# Patient Record
Sex: Male | Born: 1995 | Race: Black or African American | Hispanic: No | Marital: Single | Smoking: Never smoker
Health system: Southern US, Community
[De-identification: ages and names within clinical notes are randomized; demographics above are authoritative.]

---

## 2014-11-16 ENCOUNTER — Encounter (HOSPITAL_COMMUNITY): Payer: Self-pay | Admitting: Emergency Medicine

## 2014-11-16 ENCOUNTER — Emergency Department (HOSPITAL_COMMUNITY)

## 2014-11-16 ENCOUNTER — Emergency Department (HOSPITAL_COMMUNITY)
Admission: EM | Admit: 2014-11-16 | Discharge: 2014-11-16 | Disposition: A | Discharge status: Home / Self Care | Attending: Emergency Medicine | Admitting: Emergency Medicine

## 2014-11-16 DIAGNOSIS — Y998 Other external cause status: Secondary | ICD-10-CM | POA: Insufficient documentation

## 2014-11-16 DIAGNOSIS — Y92310 Basketball court as the place of occurrence of the external cause: Secondary | ICD-10-CM | POA: Insufficient documentation

## 2014-11-16 DIAGNOSIS — Y9367 Activity, basketball: Secondary | ICD-10-CM | POA: Insufficient documentation

## 2014-11-16 DIAGNOSIS — W51XXXA Accidental striking against or bumped into by another person, initial encounter: Secondary | ICD-10-CM | POA: Diagnosis not present

## 2014-11-16 DIAGNOSIS — S43102A Unspecified dislocation of left acromioclavicular joint, initial encounter: Secondary | ICD-10-CM | POA: Diagnosis not present

## 2014-11-16 DIAGNOSIS — S4992XA Unspecified injury of left shoulder and upper arm, initial encounter: Secondary | ICD-10-CM | POA: Diagnosis present

## 2014-11-16 NOTE — ED Provider Notes (Signed)
CSN: [id number]     Arrival date & time 11/16/14  0231 History   First MD Initiated Contact with Patient 11/16/14 0245     Chief Complaint  Patient presents with  . Shoulder Injury     (Consider location/radiation/quality/duration/timing/severity/associated sxs/prior Treatment) HPI Comments: 19 year old male who presents with left shoulder pain. Yesterday the patient was playing basketball and collided with another player. He struck his left shoulder and has now had constant, mild left shoulder pain which feels like a muscle tightness/soreness. He denies any numbness or tingling in his fingers. Normal strength and movement of his left hand and elbow. No other injuries.  Patient is a 19 y.o. male presenting with shoulder injury. The history is provided by the patient.  Shoulder Injury    History reviewed. No pertinent past medical history. History reviewed. No pertinent past surgical history. No family history on file. Social History  Substance Use Topics  . Smoking status: Never Smoker   . Smokeless tobacco: None  . Alcohol Use: No    Review of Systems 10 Systems reviewed and are negative for acute change except as noted in the HPI.   Allergies  Review of patient's allergies indicates no known allergies.  Home Medications   Prior to Admission medications   Medication Sig Start Date End Date Taking? Authorizing Provider  Phenyleph-Doxylamine-DM-APAP (NYQUIL SEVERE COLD/FLU) 5-6.25-10-325 MG/15ML LIQD Take 15 mLs by mouth as needed (for pain).   Yes Historical Provider, MD   BP 126/65 mmHg  Pulse 58  Temp(Src) 98.3 F (36.8 C) (Oral)  Resp 16  Wt 152 lb 8 oz (69.174 kg)  SpO2 100% Physical Exam  Constitutional: He appears well-developed and well-nourished. No distress.  HENT:  Head: Normocephalic and atraumatic.  Cardiovascular: Normal rate, regular rhythm, normal heart sounds and intact distal pulses.   No murmur heard. Pulmonary/Chest: Effort normal and breath  sounds normal. No respiratory distress.  Musculoskeletal:  Normal range of motion of left shoulder with mild pain at abduction greater than 90; normal strength and sensation left hand; 2+ radial pulses; normal range of motion at wrist and elbow; no obvious shoulder asymmetry  Skin: Skin is warm and dry.  Psychiatric: He has a normal mood and affect. Judgment normal.  Nursing note and vitals reviewed.   ED Course  Procedures (including critical care time)  Imaging Review Dg Shoulder Left  11/16/2014  CLINICAL DATA:  Left shoulder pain after injury playing basketball. Pain superiorly. EXAM: LEFT SHOULDER - 2+ VIEW COMPARISON:  None. FINDINGS: No acute fracture. Glenohumeral joint is congruent. Minimal superior migration of the distal clavicle and borderline widening of the acromioclavicular joint measuring 5 mm. Coracoclavicular joint space is maintained. No soft tissue abnormality. The growth plates are fusing. IMPRESSION: Suspect grade 1 AC joint separation.  No acute fracture. Electronically Signed   By: Rubye Oaks M.D.   On: 11/16/2014 03:09   I have personally reviewed and evaluated these images as part of my medical decision-making.   EKG Interpretation None      MDM   Final diagnoses:  Acromioclavicular separation, type 1, left, initial encounter    19 year old male who presents with left shoulder pain after colliding with another basketball player yesterday. She was neurovascularly intact on exam. Normal range of motion although pain with abduction of shoulder greater than 90. Plain film suggests grade 1 AC joint separation. No obvious shoulder asymmetry on exam. Instructed on supportive care and provided with sling. Provided with orthopedics follow-up information with any problems.  Patient voiced understanding of follow-up plan and return precautions. Patient discharged in satisfactory condition.    Laurence Spatesachel Morgan Hamzeh Tall, MD 11/16/14 [phone]

## 2014-11-16 NOTE — ED Notes (Signed)
Pt just returned from Xray  

## 2014-11-16 NOTE — ED Notes (Signed)
Pt. reports left shoulder joint injury yesterday while playing basketball ( collided with another player) , reports pain , stiffness and mild swelling .

## 2014-11-16 NOTE — Discharge Instructions (Signed)
Shoulder Separation °A shoulder separation (acromioclavicular separation) is an injury to the connecting tissue (ligament) between the top of your shoulder blade (acromion) and your collarbone (clavicle). °The ligament may be stretched, partially torn, or completely torn. °· A stretched ligament may not cause very much pain, and it does not move the collarbone out of place. A stretched ligament looks normal on an X-ray. °· An injury that is a bit worse may partially tear a ligament and move the collarbone slightly out of place. °· A serious injury completely tears both shoulder ligaments. This moves the collarbone severely out of position and changes the way that the shoulder looks (deformity). °CAUSES °The most common cause of a shoulder separation is falling on or receiving a blow to the top of the shoulder. Falling with an outstretched arm may also cause this injury. °RISK FACTORS °You may be at greater risk of a shoulder separation if: °· You are male. °· You are younger than age 35. °· You play a contact sport, such as football or hockey. °SIGNS AND SYMPTOMS °The most common symptom of a shoulder separation is pain on the top of the shoulder after falling on it or receiving a blow to it. Other signs and symptoms include: °· Shoulder deformity. °· Swelling of the shoulder. °· Decreased ability to move the shoulder. °· Bruising on top of the shoulder. °DIAGNOSIS °Your health care provider may suspect a shoulder separation based on your symptoms and the details of a recent injury. A physical exam will be done. During this exam, the health care provider may: °· Press on your shoulder. °· Test the movement of your shoulder. °· Ask you to hold a weight in your hand to see if the separation increases. °· Do an X-ray. °TREATMENT °· A stretch injury may require only a sling, pain medicine, and cold packs. This treatment may last for 2-12 weeks. You may also have physical therapy. A physical therapist will teach you to  do daily exercises to strengthen your shoulder muscles and prevent stiffness. °· A complete tear may require surgery to repair the torn ligament. After surgery, you will also require a sling, pain medicine, and cold packs. Recovery may take longer. You may also need more physical therapy. °HOME CARE INSTRUCTIONS °· Take medicines only as directed by your health care provider. °· Apply ice to the top of your shoulder: °· Put ice in a plastic bag. °· Place a towel between your skin and the bag. °· Leave the ice on for 20 minutes, 2-3 times a day. °· Wear your sling or splint as directed by your health care provider. °· You may be able to remove your sling to do your physical therapy exercises. °· Ask your health care provider when you can stop wearing the sling. °· Do not do any activities that make your pain worse. °· Do not lift anything that is heavier than 10 lb (4.5 kg) on the injured side of your body. °· Ask your health care provider when you can return to athletic activities. °SEEK MEDICAL CARE IF: °· Your pain medicine is not relieving your pain. °· Your pain and stiffness are not improving after 2 weeks. °· You are unable to do your physical therapy exercises because of pain or stiffness. °  °This information is not intended to replace advice given to you by your health care provider. Make sure you discuss any questions you have with your health care provider. °  °Document Released: 10/23/2004 Document Revised: 02/03/2014   Document Reviewed: 06/15/2013 °Elsevier Interactive Patient Education ©2016 [employer] ° °Acromioclavicular Separation With Rehab °The acromioclavicular joint is the joint between the roof of the shoulder (acromion) and the collarbone (clavicle). It is vulnerable to injury. An acromioclavicular (AC) separation is a partial or complete tear (sprain), injury, or redness and soreness (inflammation) of the ligaments that cross the acromioclavicular joint and hold it in place. There are two  ligaments in this area that are vulnerable to injury, the acromioclavicular ligament and the coracoclavicular ligament. °SYMPTOMS  °· Tenderness and swelling, or a bump on top of the shoulder (at the AC joint). °· Bruising (contusion) in the area within 48 hours of injury. °· Loss of strength or pain when reaching over the head or across the body. °CAUSES  °AC separation is caused by direct trauma to the joint (falling on your shoulder) or indirect trauma (falling on an outstretched arm). °RISK INCREASES WITH: °· Sports that require contact or collision, throwing sports (i.e. racquetball, squash). °· Poor strength and flexibility. °· Previous shoulder sprain or dislocation. °· Poorly fitted or padded protective equipment. °PREVENTION  °· Warm-up and stretch properly before activity. °· Maintain physical fitness: °¨ Shoulder strength. °¨ Shoulder flexibility. °¨ Cardiovascular fitness. °· Wear properly fitted and padded protective equipment. °· Learn and use proper technique when playing sports. Have a coach correct improper technique, including falling and landing. °· Apply taping, protective strapping or padding, or an adhesive bandage as recommended before practice or competition. °PROGNOSIS  °· If treated properly, the symptoms of AC separation can be expected to go away. °· If treated improperly, permanent disability may occur unless surgery is performed. °· Healing time varies with type of sport and position, arm injured (dominant versus non-dominant) and severity of sprain. °RELATED COMPLICATIONS °· Weakness and fatigue of the arm or shoulder are possible but uncommon. °· Pain and inflammation of the AC joint may continue. °· Prolonged healing time may be necessary if usual activities are resumed too early. This causes a susceptibility to recurrent injury. °· Prolonged disability may occur. °· The shoulder may remain unstable or arthritic following repeated injury. °TREATMENT  °Treatment initially involves ice  and medication to help reduce pain and inflammation. It may also be necessary to modify your activities in order to prevent further injury. °Both non-surgical and surgical interventions exist to treat AC separation. Non-surgical intervention is usually recommended and involves wearing a sling to immobilize the joint for a period of time to allow for healing. °Surgical intervention is usually only considered for severe sprains of the ligament or for individuals who do not improve after 2 to 6 months of non-surgical treatment. Surgical interventions require 4 to 6 months before a return to sports is possible. °MEDICATION °· If pain medication is necessary, nonsteroidal anti-inflammatory medications, such as aspirin and ibuprofen, or other minor pain relievers, such as acetaminophen, are often recommended. °· Do not take pain medication for 7 days before surgery. °· Prescription pain relievers may be given by your caregiver. Use only as directed and only as much as you need. °· Ointments applied to the skin may be helpful. °· Corticosteroid injections may be given to reduce inflammation. °HEAT AND COLD °· Cold treatment (icing) relieves pain and reduces inflammation. Cold treatment should be applied for 10 to 15 minutes every 2 to 3 hours for inflammation and pain and immediately after any activity that aggravates your symptoms. Use ice packs or an ice massage. °· Heat treatment may be used prior to performing   the stretching and strengthening activities prescribed by your caregiver, physical therapist or athletic trainer. Use a heat pack or a warm soak. °SEEK IMMEDIATE MEDICAL CARE IF:  °· Pain, swelling or bruising worsens despite treatment. °· There is pain, numbness or coldness in the arm. °· Discoloration appears in the fingernails. °· New, unexplained symptoms develop. °EXERCISES  °RANGE OF MOTION (ROM) AND STRETCHING EXERCISES - Acromioclavicular Separation °These exercises may help you when beginning to  rehabilitate your injury. Your symptoms may resolve with or without further involvement from your physician, physical therapist or athletic trainer. While completing these exercises, remember: °· Restoring tissue flexibility helps normal motion to return to the joints. This allows healthier, less painful movement and activity. °· An effective stretch should be held for at least 30 seconds. °· A stretch should never be painful. You should only feel a gentle lengthening or release in the stretched tissue. °ROM - Pendulum °· Bend at the waist so that your right / left arm falls away from your body. Support yourself with your opposite hand on a solid surface, such as a table or a countertop. °· Your right / left arm should be perpendicular to the ground. If it is not perpendicular, you need to lean over farther. Relax the muscles in your right / left arm and shoulder as much as possible. °· Gently sway your hips and trunk so they move your right / left arm without any use of your right / left shoulder muscles. °· Progress your movements so that your right / left arm moves side to side, then forward and backward, and finally, both clockwise and counterclockwise. °· Complete __________ repetitions in each direction. Many people use this exercise to relieve discomfort in their shoulder as well as to gain range of motion. °Repeat __________ times. Complete this exercise __________ times per day. °STRETCH - Flexion, Seated  °· Sit in a firm chair so that your right / left forearm can rest on a table or countertop. Your right / left elbow should rest below the height of your shoulder so that your shoulder feels supported and not tense or uncomfortable. °· Keeping your right / left shoulder relaxed, lean forward at your waist, allowing your right / left hand to slide forward. Bend forward until you feel a moderate stretch in your shoulder, but before you feel an increase in your pain. °· Hold __________ seconds. Slowly return  to your starting position. °Repeat __________ times. Complete this exercise __________ times per day. °STRETCH - Flexion, Standing °· Stand with good posture. With an underhand grip on your right / left and an overhand grip on the opposite hand, grasp a broomstick or cane so that your hands are a Tailyn Hantz more than shoulder-width apart. °· Keeping your right / left elbow straight and shoulder muscles relaxed, push the stick with your opposite hand to raise your right / left arm in front of your body and then overhead. Raise your arm until you feel a stretch in your right / left shoulder, but before you have increased shoulder pain. °· Try to avoid shrugging your right / left shoulder as your arm rises by keeping your shoulder blade tucked down and toward your mid-back spine. Hold __________ seconds. °· Slowly return to the starting position. °Repeat __________ times. Complete this exercise __________ times per day. °STRENGTHENING EXERCISES - Acromioclavicular Separation °These exercises may help you when beginning to rehabilitate your injury. They may resolve your symptoms with or without further involvement from your physician,   physical therapist or athletic trainer. While completing these exercises, remember: °· Muscles can gain both the endurance and the strength needed for everyday activities through controlled exercises. °· Complete these exercises as instructed by your physician, physical therapist or athletic trainer. Progress the resistance and repetitions only as guided. °· You may experience muscle soreness or fatigue, but the pain or discomfort you are trying to eliminate should never worsen during these exercises. If this pain does worsen, stop and make certain you are following the directions exactly. If the pain is still present after adjustments, discontinue the exercise until you can discuss the trouble with your clinician. °STRENGTH - Shoulder Abductors, Isometric  °· With good posture, stand or sit  about 4-6 inches from a wall with your right / left side facing the wall. °· Bend your right / left elbow. Gently press your right / left elbow into the wall. Increase the pressure gradually until you are pressing as hard as you can without shrugging your shoulder or increasing any shoulder discomfort. °· Hold __________ seconds. °· Release the tension slowly. Relax your shoulder muscles completely before you start the next repetition. °Repeat __________ times. Complete this exercise __________ times per day. °STRENGTH - Internal Rotators, Isometric °· Keep your right / left elbow at your side and bend it 90 degrees. °· Step into a door frame so that the inside of your right / left wrist can press against the door frame without your upper arm leaving your side. °· Gently press your right / left wrist into the door frame as if you were trying to draw the palm of your hand to your abdomen. Gradually increase the tension until you are pressing as hard as you can without shrugging your shoulder or increasing any shoulder discomfort. °· Hold __________ seconds. °· Release the tension slowly. Relax your shoulder muscles completely before you the next repetition. °Repeat __________ times. Complete this exercise __________ times per day.  °STRENGTH - External Rotators, Isometric °· Keep your right / left elbow at your side and bend it 90 degrees. °· Step into a door frame so that the outside of your right / left wrist can press against the door frame without your upper arm leaving your side. °· Gently press your right / left wrist into the door frame as if you were trying to swing the back of your hand away from your abdomen. Gradually increase the tension until you are pressing as hard as you can without shrugging your shoulder or increasing any shoulder discomfort. °· Hold __________ seconds. °· Release the tension slowly. Relax your shoulder muscles completely before you the next repetition. °Repeat __________ times.  Complete this exercise __________ times per day. °STRENGTH - Internal Rotators °· Secure a rubber exercise band/tubing to a fixed object so that it is at the same height as your right / left elbow when you are standing or sitting on a firm surface. °· Stand or sit so that the secured exercise band/tubing is at your right / left side. °· Bend your elbow 90 degrees. Place a folded towel or small pillow under your right / left arm so that your elbow is a few inches away from your side. °· Keeping the tension on the exercise band/tubing, pull it across your body toward your abdomen. Be sure to keep your body steady so that the movement is only coming from your shoulder rotating. °· Hold __________ seconds. Release the tension in a controlled manner as you return to the starting   position. °Repeat __________ times. Complete this exercise __________ times per day. °STRENGTH - External Rotators °· Secure a rubber exercise band/tubing to a fixed object so that it is at the same height as your right / left elbow when you are standing or sitting on a firm surface. °· Stand or sit so that the secured exercise band/tubing is at your side that is not injured. °· Bend your elbow 90 degrees. Place a folded towel or small pillow under your right / left arm so that your elbow is a few inches away from your side. °· Keeping the tension on the exercise band/tubing, pull it away from your body, as if pivoting on your elbow. Be sure to keep your body steady so that the movement is only coming from your shoulder rotating. °· Hold __________ seconds. Release the tension in a controlled manner as you return to the starting position. °Repeat __________ times. Complete this exercise __________ times per day. °  °This information is not intended to replace advice given to you by your health care provider. Make sure you discuss any questions you have with your health care provider. °  °Document Released: 01/13/2005 Document Revised: 02/03/2014  Document Reviewed: 04/27/2008 °Elsevier Interactive Patient Education ©2016 [employer] ° °

## 2016-06-18 IMAGING — CR DG SHOULDER 2+V*L*
3 series · 3 of 3 positions shown · non-contrast
Comparison: None.

CLINICAL DATA: Left shoulder pain after injury playing basketball.
Pain superiorly.

EXAM:
LEFT SHOULDER - 2+ VIEW

[shoulder grashey]
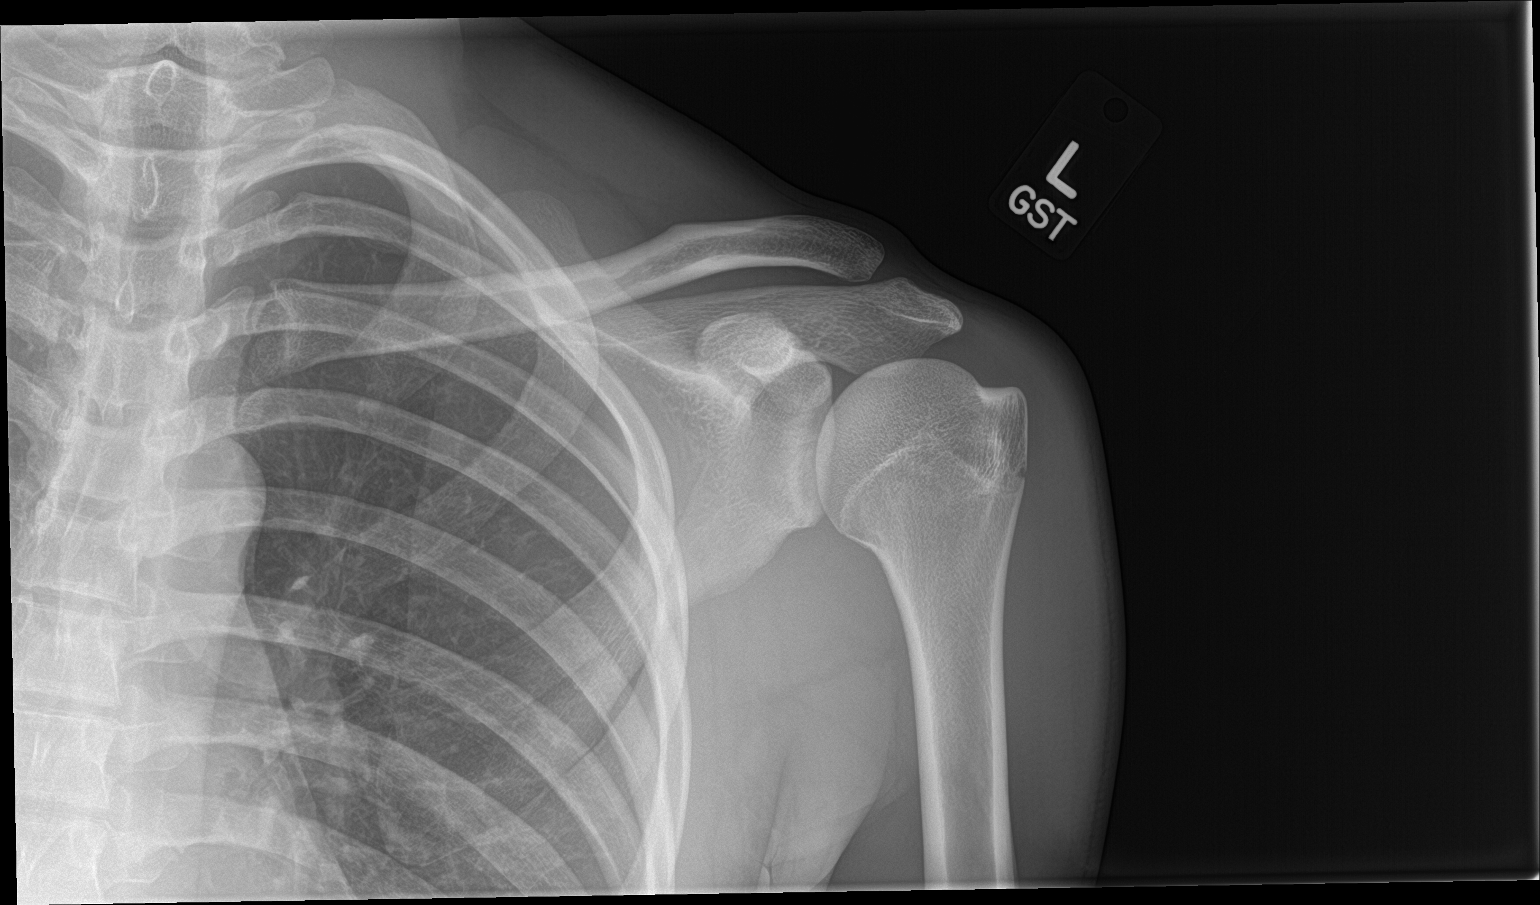

[shoulder y view]
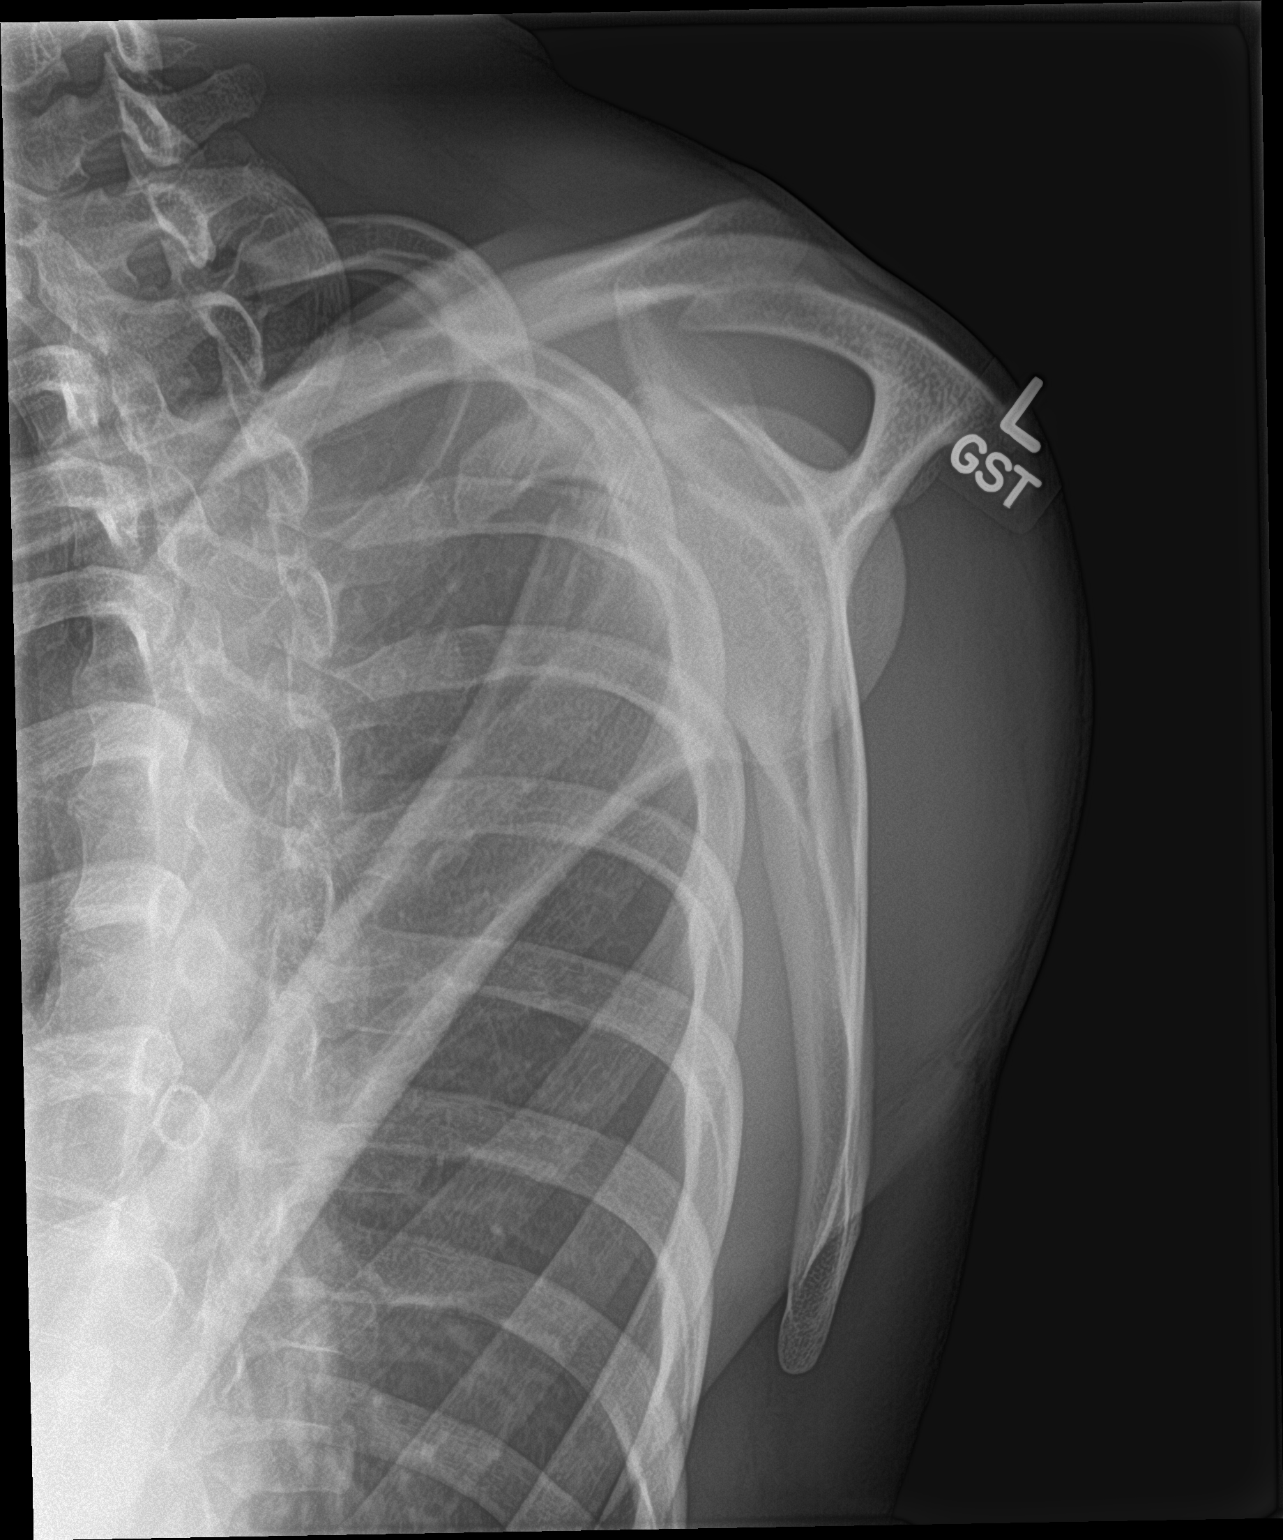

[shoulder axillary]
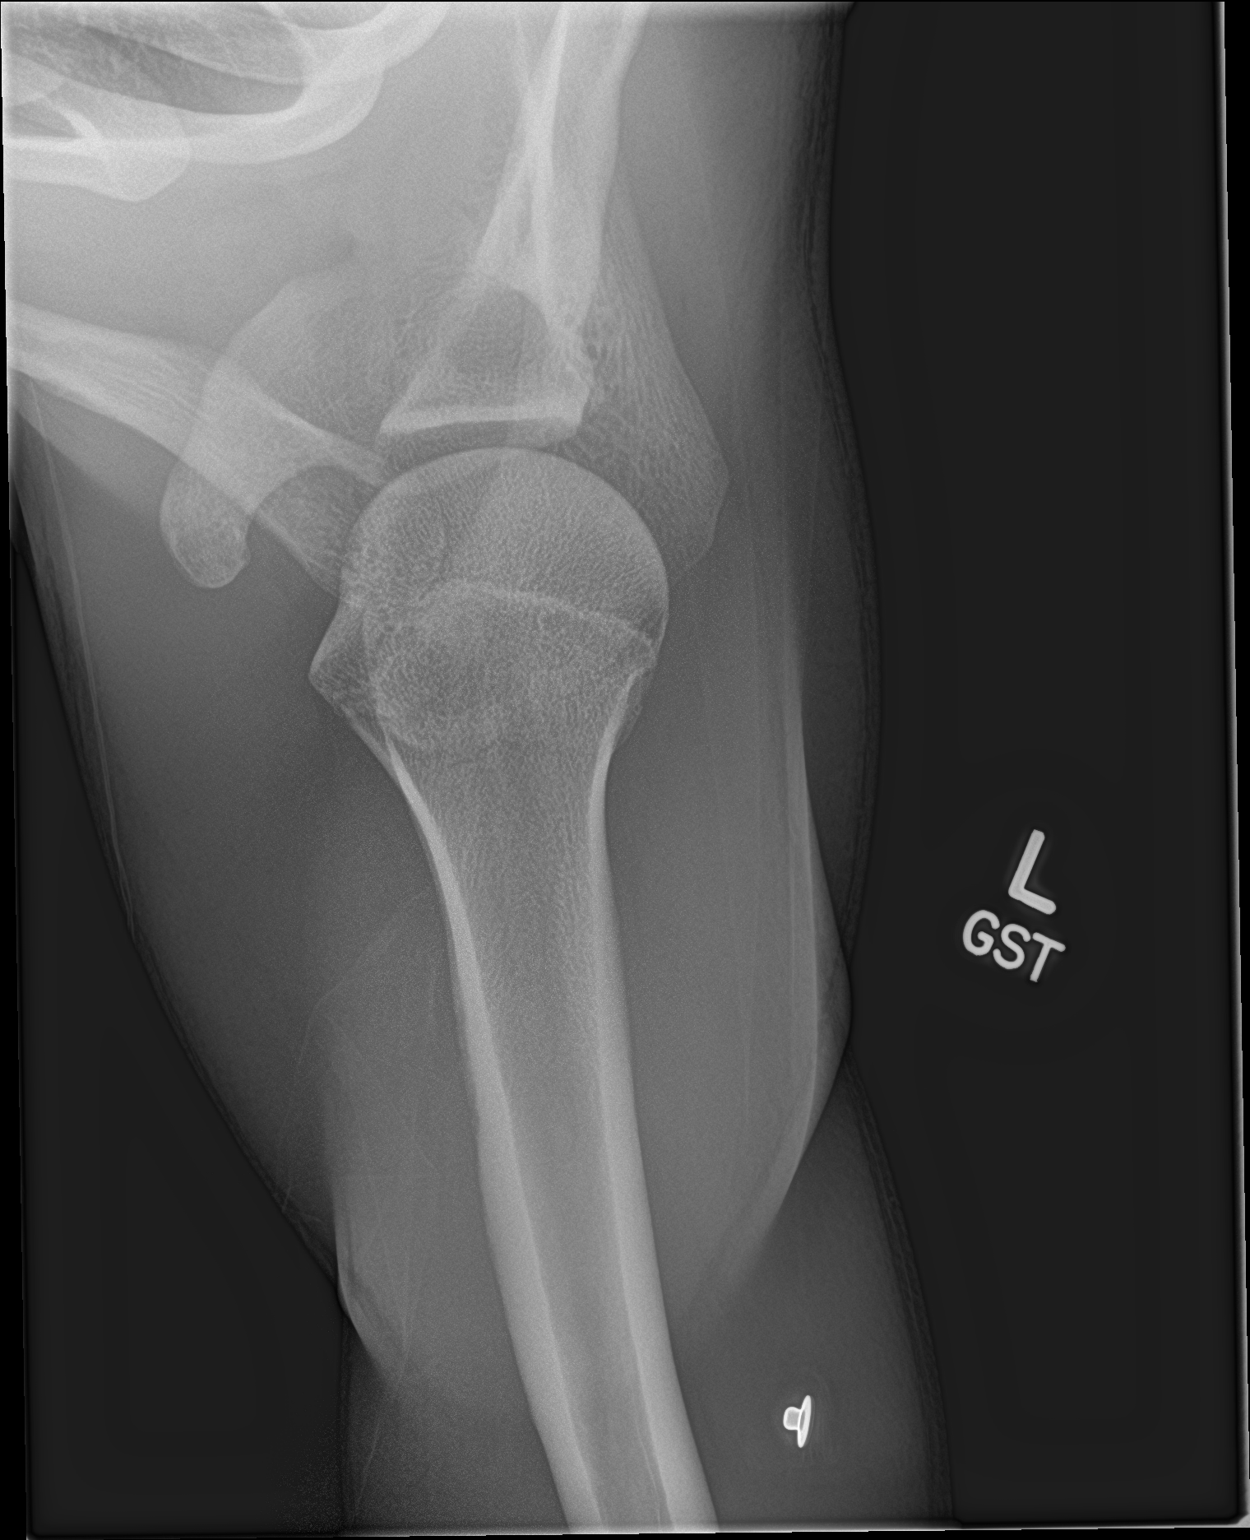

[3 of 3 positions shown; findings below may reference images not displayed]

FINDINGS: No acute fracture. Glenohumeral joint is congruent. Minimal superior
migration of the distal clavicle and borderline widening of the
acromioclavicular joint measuring 5 mm. Coracoclavicular joint space
is maintained. No soft tissue abnormality. The growth plates are
fusing.
IMPRESSION: Suspect grade 1 AC joint separation.  No acute fracture.
